# Patient Record
Sex: Male | Born: 1945 | Race: White | Hispanic: No | Marital: Married | Smoking: Never smoker
Health system: Southern US, Community
[De-identification: ages and names within clinical notes are randomized; demographics above are authoritative.]

## PROBLEM LIST (undated history)

## (undated) DIAGNOSIS — I499 Cardiac arrhythmia, unspecified: Secondary | ICD-10-CM

## (undated) HISTORY — DX: Cardiac arrhythmia, unspecified: I49.9

## (undated) HISTORY — PX: OTHER SURGICAL HISTORY: SHX169

---

## 2000-12-02 ENCOUNTER — Encounter: Admission: RE | Admit: 2000-12-02 | Discharge: 2000-12-02 | Payer: Self-pay | Admitting: *Deleted

## 2000-12-03 ENCOUNTER — Encounter: Admission: RE | Admit: 2000-12-03 | Discharge: 2001-03-03 | Payer: Self-pay | Admitting: *Deleted

## 2002-05-04 ENCOUNTER — Encounter: Payer: Self-pay | Admitting: Sports Medicine

## 2002-05-04 ENCOUNTER — Ambulatory Visit (HOSPITAL_COMMUNITY): Admission: RE | Admit: 2002-05-04 | Discharge: 2002-05-04 | Payer: Self-pay | Admitting: Sports Medicine

## 2012-11-17 ENCOUNTER — Encounter (HOSPITAL_COMMUNITY): Payer: Self-pay | Admitting: *Deleted

## 2012-11-17 ENCOUNTER — Emergency Department (HOSPITAL_COMMUNITY)
Admission: EM | Admit: 2012-11-17 | Discharge: 2012-11-17 | Disposition: A | Discharge status: Nursing Home (Includes ICF) | Payer: 59 | Source: Home / Self Care | Attending: Family Medicine | Admitting: Family Medicine

## 2012-11-17 ENCOUNTER — Emergency Department (HOSPITAL_COMMUNITY): Payer: 59

## 2012-11-17 ENCOUNTER — Other Ambulatory Visit: Payer: Self-pay

## 2012-11-17 ENCOUNTER — Emergency Department (HOSPITAL_COMMUNITY)
Admission: EM | Admit: 2012-11-17 | Discharge: 2012-11-17 | Disposition: A | Discharge status: Home / Self Care | Payer: 59 | Attending: Emergency Medicine | Admitting: Emergency Medicine

## 2012-11-17 ENCOUNTER — Encounter (HOSPITAL_COMMUNITY): Payer: Self-pay | Admitting: Emergency Medicine

## 2012-11-17 DIAGNOSIS — I498 Other specified cardiac arrhythmias: Secondary | ICD-10-CM | POA: Insufficient documentation

## 2012-11-17 DIAGNOSIS — R008 Other abnormalities of heart beat: Secondary | ICD-10-CM

## 2012-11-17 DIAGNOSIS — I10 Essential (primary) hypertension: Secondary | ICD-10-CM

## 2012-11-17 LAB — POCT I-STAT TROPONIN I: Troponin i, poc: 0.02 ng/mL (ref 0.00–0.08)

## 2012-11-17 LAB — POCT I-STAT, CHEM 8
HCT: 44 % (ref 39.0–52.0)
Hemoglobin: 15 g/dL (ref 13.0–17.0)
Potassium: 4.2 mEq/L (ref 3.5–5.1)
Sodium: 139 mEq/L (ref 135–145)
TCO2: 24 mmol/L (ref 0–100)

## 2012-11-17 LAB — TSH: TSH: 1.616 u[IU]/mL (ref 0.350–4.500)

## 2012-11-17 NOTE — ED Provider Notes (Signed)
Pt presents with asymptomatic ventricular bigeminy.  He has no acute symptoms including no fevers chills nausea vomiting shortness of breath chest pain or palpitations. This is asymptomatic, seen on EKG at his dental office as well as at the urgent care prior to arrival. His EKG here shows ventricular bigeminy however on the monitor during my dilation the patient is a normal sinus rhythm and has a normal cardiac and pulmonary exam with no murmurs, no swelling, normal lung sounds and normal mental status. Labs and chest x-ray to evaluate for other sources, the patient will be able to followup in the outpatient setting for Holter monitor testing as guided in needed by cardiology. We'll give referral for outpatient followup.  I  have seen and agree with the EKG interpretation is provided by the resident.  Vida Roller, MD 11/17/12 [phone]

## 2012-11-17 NOTE — ED Notes (Signed)
Pt sent from [facility] with a reported HR in the 30's Pt HR in triage 37-78. Pt denies any dizziness or CP.

## 2012-11-17 NOTE — ED Notes (Signed)
Patient reports to [facility] from [facility] via POV for low HR in the 30s. Patient reports that he went to dentist yesterday for a procedure and was told that he had some "irregularities with his heart" and should f/u with PCP or urgent care. Patient reported to [facility] today and was noted have normal HR that occasionally drops in the 30s. Patient denies CP, SOB, dizziness, syncope, or recent illness. Denies any cardiac hx

## 2012-11-17 NOTE — ED Notes (Signed)
Attempted calling pts family in waiting room twice no response.

## 2012-11-17 NOTE — ED Provider Notes (Signed)
History    CSN: [id number] Arrival date & time 11/17/12  1642  First MD Initiated Contact with Patient 11/17/12 1651     Chief Complaint  Patient presents with  . Irregular Heart Beat   (Consider location/radiation/quality/duration/timing/severity/associated sxs/prior Treatment) HPI Comments: Pt w/ no significant PMHx now w/ ectopy. Had oral surgery yesterday and noted frequent ectopy on monitor. Went to urgent care today and noted HR ranging from 30-90, ECG w/ bigeminy and mild HTN. Pt is completely asymptomatic. Denies chest pain, dyspnea, palpitations, LH/dizzy or syncope. No hx of CAD. Healthy - exercises strenuously 4x/wk w/out dyspnea or CP. + family hx of CHF. No recent travel, tic bites, rash, wt gain/loss. On arrival HR is 30 in triage - when placed on monitor in room - NSR at 80 - no ectopy.  Patient is a 67 y.o. male presenting with general illness. The history is provided by the patient. No language interpreter was used.  Illness Location:  Cardio Quality:  Ectopy  Severity:  Moderate Onset quality:  Sudden Timing:  Intermittent Progression:  Unchanged Chronicity:  New Associated symptoms: no abdominal pain, no chest pain, no congestion, no cough, no diarrhea, no fever, no headaches, no nausea, no rash, no shortness of breath, no sore throat and no vomiting    History reviewed. No pertinent past medical history. Past Surgical History  Procedure Laterality Date  . Tendon repair      quad and achilles   No family history on file. History  Substance Use Topics  . Smoking status: Never Smoker   . Smokeless tobacco: Not on file  . Alcohol Use: Yes    Review of Systems  Constitutional: Negative for fever and chills.  HENT: Negative for congestion and sore throat.   Respiratory: Negative for cough and shortness of breath.   Cardiovascular: Negative for chest pain, palpitations and leg swelling.  Gastrointestinal: Negative for nausea, vomiting, abdominal pain,  diarrhea and constipation.  Genitourinary: Negative for dysuria and frequency.  Skin: Negative for color change and rash.  Neurological: Negative for dizziness and headaches.  Psychiatric/Behavioral: Negative for confusion and agitation.  All other systems reviewed and are negative.    Allergies  Review of patient's allergies indicates no known allergies.  Home Medications  No current outpatient prescriptions on file. BP 165/62  Pulse 37  Temp(Src) 98 F (36.7 C) (Oral)  Resp 18  SpO2 95% Physical Exam  Constitutional: He is oriented to person, place, and time. He appears well-developed and well-nourished. No distress.  HENT:  Head: Normocephalic and atraumatic.  Eyes: EOM are normal. Pupils are equal, round, and reactive to light.  Neck: Normal range of motion. Neck supple.  Cardiovascular: Normal rate, regular rhythm and normal pulses.  Frequent extrasystoles are present.  Pulses:      Radial pulses are 2+ on the right side, and 2+ on the left side.  Pulmonary/Chest: Effort normal and breath sounds normal. No respiratory distress. He has no decreased breath sounds. He has no wheezes. He has no rhonchi. He has no rales.  Abdominal: Soft. He exhibits no distension.  Musculoskeletal: Normal range of motion. He exhibits no edema.  Neurological: He is alert and oriented to person, place, and time.  Skin: Skin is warm and dry.  Psychiatric: He has a normal mood and affect. His behavior is normal.    ED Course  Procedures (including critical care time) Labs Reviewed  TSH   Results for orders placed during the hospital encounter of 11/17/12  TSH      Result Value Range   TSH 1.616  0.350 - 4.500 uIU/mL  POCT I-STAT, CHEM 8      Result Value Range   Sodium 139  135 - 145 mEq/L   Potassium 4.2  3.5 - 5.1 mEq/L   Chloride 103  96 - 112 mEq/L   BUN 14  6 - 23 mg/dL   Creatinine, Ser 1.61  0.50 - 1.35 mg/dL   Glucose, Bld 096 (*) 70 - 99 mg/dL   Calcium, Ion 0.45 (*) 1.13 -  1.30 mmol/L   TCO2 24  0 - 100 mmol/L   Hemoglobin 15.0  13.0 - 17.0 g/dL   HCT 40.9  81.1 - 91.4 %  POCT I-STAT TROPONIN I      Result Value Range   Troponin i, poc 0.02  0.00 - 0.08 ng/mL   Comment 3             Date: 11/18/2012  Rate: 78  Rhythm: normal sinus rhythm and bigeminy  QRS Axis: left  Intervals: normal  ST/T Wave abnormalities: normal  Conduction Disutrbances:none  Narrative Interpretation:   Old EKG Reviewed: none available   No results found. No diagnosis found.  MDM  Exam as above, asymptomatic, NAD, ECG w/ bigeminy, no acute ischemia - no ST elevation. CXR - NACPF, troponin negative, TSH normal, no electrolyte abnormality. At this time doubt ACS. Bigeminy of unknown etiology. No bradycardia noted - recorded pulse in 30s was from pulse ox - reading PVC as T wave. No high grade block on ECG. No indication to admit to hospital. Recommend close follow up w/ pcp this wk and given referral to cardiology for further eval. D/c home in good and stable condition. Given return precautions for chest pain, dyspnea, palpitations or syncope.   I have personally reviewed labs and imaging and considered in my MDM. Case d/w Dr Hyacinth Meeker.   1. Bigeminal pulse    [facility] [address], [address] [place] [address] [phone]   for Holter Monitor     Audelia Hives, MD 11/18/12 [phone]

## 2012-11-17 NOTE — ED Notes (Signed)
Pt reports being told by the oral surgeon yesterday that his heart beat was irregular (extra beat) Pt denies chest pain and sob. Family hx of CHF. "feels fine" Pt is sitting up right alert/oriented.

## 2012-11-17 NOTE — ED Notes (Signed)
MD at bedside. 

## 2012-11-17 NOTE — ED Provider Notes (Addendum)
History    CSN: [id number] Arrival date & time 11/17/12  1544  First MD Initiated Contact with Patient 11/17/12 1554     Chief Complaint  Patient presents with  . Irregular Heart Beat    told by oral surgeon that his heart beat was irregular   (Consider location/radiation/quality/duration/timing/severity/associated sxs/prior Treatment) HPI Comments: 67 year old male non smoker, with no significant past medical history and on no medications. Has not been seen by a medical provider in over 10 years. Comes complaining of "irregular heartbeat". Patient was having a dental procedure yesterday when he was attached to a heart monitor; was told he had and irregular heart beat and was asked to go to the hospital to have it checked. He denies dizziness or syncope. Denies palpitations, chest pain or shortness of breath. No PND, fatigue on exertion or leg swelling. No prior personal or family h/o of heart problems.   History reviewed. No pertinent past medical history. Past Surgical History  Procedure Laterality Date  . Tendon repair      quad and achilles   History reviewed. No pertinent family history. History  Substance Use Topics  . Smoking status: Never Smoker   . Smokeless tobacco: Not on file  . Alcohol Use: Yes    Review of Systems  Constitutional: Negative for fever, chills, diaphoresis, activity change, appetite change and fatigue.  Eyes: Negative for visual disturbance.  Respiratory: Negative for shortness of breath and wheezing.   Cardiovascular: Negative for chest pain, palpitations and leg swelling.  Gastrointestinal: Negative for nausea, vomiting and abdominal pain.  Endocrine: Negative for cold intolerance, heat intolerance, polydipsia, polyphagia and polyuria.  Neurological: Negative for dizziness, syncope and headaches.  All other systems reviewed and are negative.    Allergies  Review of patient's allergies indicates no known allergies.  Home Medications  No  current outpatient prescriptions on file. BP 173/64  Pulse 38  Temp(Src) 98.2 F (36.8 C) (Oral)  Resp 14  SpO2 99% Physical Exam  Nursing note and vitals reviewed. Constitutional: He is oriented to person, place, and time. He appears well-developed and well-nourished. No distress.  Somehow flushed face  HENT:  Head: Normocephalic and atraumatic.  Mouth/Throat: Oropharynx is clear and moist.  Eyes: Conjunctivae are normal. Pupils are equal, round, and reactive to light. No scleral icterus.  Neck: Neck supple. No JVD present. No thyromegaly present.  Cardiovascular: Intact distal pulses.  Exam reveals no gallop and no friction rub.   No murmur heard. Is frequent premature beats.  No low extremity edema  Pulmonary/Chest: Effort normal and breath sounds normal. No respiratory distress. He has no wheezes. He has no rales. He exhibits no tenderness.  Abdominal: Soft. Bowel sounds are normal. He exhibits no distension and no mass. There is no tenderness. There is no rebound and no guarding.  Neurological: He is alert and oriented to person, place, and time.  Skin: He is not diaphoretic.    ED Course  Procedures (including critical care time) Labs Reviewed - No data to display No results found. 1. Ventricular bigeminy   2. Hypertension    EKG: Ventricular rate 79 beats per minutes sinus rhythm with a first degree AV block and bigeminy type of premature ventricular contractions. Right bundle branch block. Left axis deviation. No obvious acute ischemic changes. No prior EKG for comparison.   MDM  66 year old male here c/o irregular heart beats noted during a dental procedure yesterday. On exam: Patient heart rate from 38-79 bpm blood pressure with somehow  wide differential 173/64 otherwise stable vital signs. Normal lung exam and no low extremity edema. Patient asymptomatic here. Decided to transfer patient to [facility] for further evaluation and management. No other  testing has been performed at [facility] prior to transfer.  Sharin Grave, MD 11/17/12 [id number]  Sharin Grave, MD 11/17/12 1635

## 2012-11-20 NOTE — ED Provider Notes (Signed)
Medical screening examination/treatment/procedure(s) were conducted as a shared visit with non-physician practitioner(s) and myself.  I personally evaluated the patient during the encounter  Please see my separate respective documentation pertaining to this patient encounter  I have personally reviewed and interpreted the EKG and agree with the resident interpretation.  Vida Roller, MD 11/20/12 [phone]

## 2012-12-14 ENCOUNTER — Encounter: Payer: Self-pay | Admitting: Cardiovascular Disease

## 2012-12-14 ENCOUNTER — Encounter (INDEPENDENT_AMBULATORY_CARE_PROVIDER_SITE_OTHER): Payer: 59

## 2012-12-14 ENCOUNTER — Telehealth: Payer: Self-pay | Admitting: *Deleted

## 2012-12-14 ENCOUNTER — Other Ambulatory Visit: Payer: Self-pay | Admitting: *Deleted

## 2012-12-14 DIAGNOSIS — R002 Palpitations: Secondary | ICD-10-CM

## 2012-12-14 NOTE — Telephone Encounter (Signed)
24 hr Holter monitor placed on Pt 12/14/12 TK

## 2012-12-20 ENCOUNTER — Telehealth: Payer: Self-pay | Admitting: Cardiovascular Disease

## 2012-12-20 NOTE — Telephone Encounter (Signed)
New Problem  Pt is returning a call about holt monitor// was told to bring it back in and drop off. A rep would get in touch w/ him... Pt states he hasn't heard back from anyone/// please assist.

## 2012-12-22 NOTE — Telephone Encounter (Signed)
LMTCB ./CY 

## 2012-12-22 NOTE — Telephone Encounter (Signed)
PT AWARE WILL CALL  WITH RESULTS ONCE DR Eden Emms REVIEWS TEST .Zack Seal

## 2012-12-22 NOTE — Telephone Encounter (Signed)
Pt rtn call to christine [phone]

## 2012-12-24 ENCOUNTER — Telehealth: Payer: Self-pay | Admitting: *Deleted

## 2012-12-24 NOTE — Telephone Encounter (Signed)
PT AWARE OF  HOLTER RESULTS PER DR NISHAN   SR  PAC'S PVC'S  NO SIG  VT OR HEART BLOCK  F/U APPT MADE WITH DR  Ladona Ridgel FOR  01-20-13 AT  3:30 PM .Zack Seal

## 2013-01-04 ENCOUNTER — Encounter: Payer: Self-pay | Admitting: Internal Medicine

## 2013-01-04 ENCOUNTER — Ambulatory Visit (INDEPENDENT_AMBULATORY_CARE_PROVIDER_SITE_OTHER): Payer: 59 | Admitting: Internal Medicine

## 2013-01-04 VITALS — BP 188/82 | HR 79 | Ht 70.5 in | Wt 199.4 lb

## 2013-01-04 DIAGNOSIS — I10 Essential (primary) hypertension: Secondary | ICD-10-CM

## 2013-01-04 DIAGNOSIS — I493 Ventricular premature depolarization: Secondary | ICD-10-CM

## 2013-01-04 DIAGNOSIS — I4949 Other premature depolarization: Secondary | ICD-10-CM

## 2013-01-04 DIAGNOSIS — I472 Ventricular tachycardia: Secondary | ICD-10-CM

## 2013-01-04 MED ORDER — CARVEDILOL 3.125 MG PO TABS: 3.1250 mg | ORAL_TABLET | Freq: Two times a day (BID) | ORAL | Status: DC

## 2013-01-04 NOTE — Patient Instructions (Addendum)
Your physician has requested that you have an exercise tolerance test. For further information please visit [email or web address] Please also follow instruction sheet, as given.   Your physician has requested that you have an echocardiogram. Echocardiography is a painless test that uses sound waves to create images of your heart. It provides your doctor with information about the size and shape of your heart and how well your heart's chambers and valves are working. This procedure takes approximately one hour. There are no restrictions for this procedure.  Your physician has recommended you make the following change in your medication:  1) Start Carvedilol 3.125mg  one tablet twice daily

## 2013-01-12 ENCOUNTER — Ambulatory Visit (HOSPITAL_COMMUNITY): Payer: 59 | Attending: Cardiology | Admitting: Radiology

## 2013-01-12 ENCOUNTER — Encounter: Payer: Self-pay | Admitting: Internal Medicine

## 2013-01-12 DIAGNOSIS — I079 Rheumatic tricuspid valve disease, unspecified: Secondary | ICD-10-CM | POA: Insufficient documentation

## 2013-01-12 DIAGNOSIS — I10 Essential (primary) hypertension: Secondary | ICD-10-CM | POA: Insufficient documentation

## 2013-01-12 DIAGNOSIS — I493 Ventricular premature depolarization: Secondary | ICD-10-CM | POA: Insufficient documentation

## 2013-01-12 DIAGNOSIS — I379 Nonrheumatic pulmonary valve disorder, unspecified: Secondary | ICD-10-CM | POA: Insufficient documentation

## 2013-01-12 DIAGNOSIS — I4949 Other premature depolarization: Secondary | ICD-10-CM | POA: Insufficient documentation

## 2013-01-12 DIAGNOSIS — I059 Rheumatic mitral valve disease, unspecified: Secondary | ICD-10-CM | POA: Insufficient documentation

## 2013-01-12 NOTE — Progress Notes (Signed)
Echocardiogram performed.  

## 2013-01-12 NOTE — Assessment & Plan Note (Signed)
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 He is minimally symptomatic. I have recommended initiation of a beta blocker. Will follow.

## 2013-01-12 NOTE — Progress Notes (Signed)
HPI Mr. Hack is referred today by the ER for evaluation of PVC's. The patient is a pleasant 67 yo man who has been otherwise healthy. He has palpitations and an ECG demonstrates frequent PVC's in a bigeminal fashion. The patient has mild palpitations and a recent 2D echo demonstrates preserved LV function. He has not had syncope or chest pressure. He has been on no medical therapy to date. He denies syncope. The QRS morphology of his PVC's demonstrates a RBBB morphology, and a superior axis with transition from positive to negative in lead V4. He denies peripheral edema. No Known Allergies   Current Outpatient Prescriptions  Medication Sig Dispense Refill  . cephALEXin (KEFLEX) 500 MG capsule Take 500 mg by mouth 4 (four) times daily.      Marland Kitchen HYDROcodone-acetaminophen (NORCO) 10-325 MG per tablet Take 1 tablet by mouth every 4 (four) hours as needed for pain.      . carvedilol (COREG) 3.125 MG tablet Take 1 tablet (3.125 mg total) by mouth 2 (two) times daily.  180 tablet  3   No current facility-administered medications for this visit.     Past Medical History  Diagnosis Date  . Irregular heart beat     ROS:   All systems reviewed and negative except as noted in the HPI.   Past Surgical History  Procedure Laterality Date  . Tendon repair      quad and achilles     No family history on file.   History   Social History  . Marital Status: Married    Spouse Name: N/A    Number of Children: N/A  . Years of Education: N/A   Occupational History  . Not on file.   Social History Main Topics  . Smoking status: Never Smoker   . Smokeless tobacco: Never Used  . Alcohol Use: Yes  . Drug Use: No  . Sexual Activity: Yes   Other Topics Concern  . Not on file   Social History Narrative  . No narrative on file     BP 188/82  Pulse 79  Ht 5' 10.5" (1.791 m)  Wt 199 lb 6.4 oz (90.447 kg)  BMI 28.2 kg/m2  Physical Exam:  Well appearing NAD HEENT:  Unremarkable Neck:  No JVD, no thyromegally Lymphatics:  No adenopathy Back:  No CVA tenderness Lungs:  Clear with no wheezes HEART:  IRegular rate rhythm, no murmurs, no rubs, no clicks Abd:  soft, positive bowel sounds, no organomegally, no rebound, no guarding Ext:  2 plus pulses, no edema, no cyanosis, no clubbing Skin:  No rashes no nodules Neuro:  CN II through XII intact, motor grossly intact  ECG: NSR with frequent PVC's in a bigeminal fashion.  Assess/Plan:

## 2013-01-12 NOTE — Assessment & Plan Note (Signed)
His blood pressure is elevated. I have asked him to start a low dose of beta blocker and will and reduce his sodium intake.

## 2013-01-20 ENCOUNTER — Ambulatory Visit: Payer: 59 | Admitting: Internal Medicine

## 2013-01-24 ENCOUNTER — Ambulatory Visit (INDEPENDENT_AMBULATORY_CARE_PROVIDER_SITE_OTHER): Payer: 59 | Admitting: Internal Medicine

## 2013-01-24 DIAGNOSIS — I4949 Other premature depolarization: Secondary | ICD-10-CM

## 2013-01-24 DIAGNOSIS — I493 Ventricular premature depolarization: Secondary | ICD-10-CM

## 2013-01-24 MED ORDER — CARVEDILOL 6.25 MG PO TABS: 6.2500 mg | ORAL_TABLET | Freq: Two times a day (BID) | ORAL | Status: DC

## 2013-01-24 NOTE — Patient Instructions (Signed)
Your physician has recommended you make the following change in your medication:   1. Increase Carvedilol to 6.25 mg twice daily.

## 2013-01-24 NOTE — Addendum Note (Signed)
Addended by: Blanchard Willhite, Bartolo Darter D on: 01/24/2013 12:20 PM   Modules accepted: Orders, Medications

## 2013-01-24 NOTE — Progress Notes (Signed)
Exercise Treadmill Test  Pre-Exercise Testing Evaluation Rhythm: normal sinus  Rate: 70     Test  Exercise Tolerance Test Ordering MD: Lewayne Bunting, MD  Interpreting MD: Lewayne Bunting, MD  Unique Test No: 1  Treadmill:  1  Indication for ETT: PVC's  Contraindication to ETT: No   Stress Modality: exercise - treadmill  Cardiac Imaging Performed: non   Protocol: standard Bruce - maximal  Max BP:  231/90  Max MPHR (bpm):  152 85% MPR (bpm):  139  MPHR obtained (bpm):  160 % MPHR obtained:  105  Reached 85% MPHR (min:sec):  3:39 Total Exercise Time (min-sec):  9:00  Workload in METS:  10.1 Borg Scale: 15  Reason ETT Terminated:  exaggerated hypertensive response    ST Segment Analysis At Rest: normal ST segments - no evidence of significant ST depression With Exercise: no evidence of significant ST depression  Other Information Arrhythmia:  No Angina during ETT:  absent (0) Quality of ETT:  diagnostic  ETT Interpretation:  normal - no evidence of ischemia by ST analysis  Comments: Hypertensive response to exercise. No exercise induced arrhythmias or pVC's. PVC's noticed in recovery  Recommendations: Uptitrate beta blocker.

## 2013-02-08 ENCOUNTER — Encounter: Payer: 59 | Attending: Family Medicine

## 2013-02-08 VITALS — Ht 60.0 in | Wt 195.1 lb

## 2013-02-08 DIAGNOSIS — Z713 Dietary counseling and surveillance: Secondary | ICD-10-CM | POA: Insufficient documentation

## 2013-02-08 DIAGNOSIS — E119 Type 2 diabetes mellitus without complications: Secondary | ICD-10-CM | POA: Insufficient documentation

## 2013-02-15 ENCOUNTER — Encounter: Payer: 59 | Admitting: *Deleted

## 2013-02-15 DIAGNOSIS — E119 Type 2 diabetes mellitus without complications: Secondary | ICD-10-CM

## 2013-02-15 NOTE — Progress Notes (Signed)
Patient was seen on 02/08/13 for the first of a series of three diabetes self-management courses at the [facility].  Current HbA1c: 7.7%  The following learning objectives were met by the patient during this class:  Describe diabetes  State some common risk factors for diabetes  Defines the role of glucose and insulin  Identifies type of diabetes and pathophysiology  Describe the relationship between diabetes and cardiovascular risk  State the members of the Healthcare Team  States the rationale for glucose monitoring  State when to test glucose  State their individual Target Range  State the importance of logging glucose readings  Describe how to interpret glucose readings  Identifies A1C target  Explain the correlation between A1c and eAG values  State symptoms and treatment of high blood glucose  State symptoms and treatment of low blood glucose  Explain proper technique for glucose testing  Identifies proper sharps disposal  Handouts given during class include:  Living Well with Diabetes book  Carb Counting and Meal Planning book  Meal Plan Card  Carbohydrate guide  Meal planning worksheet  Low Sodium Flavoring Tips  The diabetes portion plate  Low Carbohydrate Snack Suggestions  A1c to eAG Conversion Chart  Diabetes Medications  Stress Management  Diabetes Recommended Care Schedule  Diabetes Success Plan  Core Class Satisfaction Survey  Your patient has identified their diabetes care support plan as:  [facility]  Staff  PCP  Follow-Up Plan:  Attend core 2  

## 2013-02-16 ENCOUNTER — Telehealth: Payer: Self-pay | Admitting: Internal Medicine

## 2013-02-16 NOTE — Progress Notes (Signed)
Patient was seen on 02/15/13 for the second of a series of three diabetes self-management courses at the [facility]. The following learning objectives were met by the patient during this class:   Describe the role of different macronutrients on glucose  Explain how carbohydrates affect blood glucose  State what foods contain the most carbohydrates  Demonstrate carbohydrate counting  Demonstrate how to read Nutrition Facts food label  Describe effects of various fats on heart health  Describe the importance of good nutrition for health and healthy eating strategies  Describe techniques for managing your shopping, cooking and meal planning  List strategies to follow meal plan when dining out  Describe the effects of alcohol on glucose and how to use it safely   Follow-Up Plan:  Attend Core 3  Work towards following your personal food plan.    

## 2013-02-16 NOTE — Telephone Encounter (Signed)
New Problem   Pt recently had an GXT and was placed on BP Meds//He asks if a f/up appt is needed// please advise

## 2013-02-18 NOTE — Telephone Encounter (Signed)
Will have patient see Dr Ladona Ridgel in 4 weeks

## 2013-02-22 DIAGNOSIS — E119 Type 2 diabetes mellitus without complications: Secondary | ICD-10-CM

## 2013-02-28 NOTE — Progress Notes (Signed)
Patient was seen on 02/22/13 for the third of a series of three diabetes self-management courses at the [facility]. The following learning objectives were met by the patient during this class:    State the amount of activity recommended for healthy living   Describe activities suitable for individual needs   Identify ways to regularly incorporate activity into daily life   Identify barriers to activity and ways to over come these barriers  Identify diabetes medications being personally used and their primary action for lowering glucose and possible side effects   Describe role of stress on blood glucose and develop strategies to address psychosocial issues   Identify diabetes complications and ways to prevent them  Explain how to manage diabetes during illness   Evaluate success in meeting personal goal   Establish 2-3 goals that they will plan to diligently work on until they return for the free 80-month follow-up visit  Your patient has established the following 4 month goals in their individualized success plan:  Count Carbohydrates at most meals and snacks, reduce fat intake by eating less mayo, salad dressing etc at 2 or more meals/day  Increase activity at least 4 days a week  Take diabetes medications as directed and test my glucose at least 1/day, 5 days a week  To help manage my stress, I will walk dog at least 6 times a week  Your patient has identified these potential barriers to change:  Stress at work - can be mitigated  Your patient has identified their diabetes self-care support plan as  Build family support through communication

## 2013-03-08 ENCOUNTER — Encounter: Payer: Self-pay | Admitting: Internal Medicine

## 2013-03-08 ENCOUNTER — Ambulatory Visit (INDEPENDENT_AMBULATORY_CARE_PROVIDER_SITE_OTHER): Payer: 59 | Admitting: Internal Medicine

## 2013-03-08 VITALS — BP 146/88 | HR 69 | Ht 70.0 in | Wt 189.0 lb

## 2013-03-08 DIAGNOSIS — I10 Essential (primary) hypertension: Secondary | ICD-10-CM

## 2013-03-08 DIAGNOSIS — I4949 Other premature depolarization: Secondary | ICD-10-CM

## 2013-03-08 DIAGNOSIS — I493 Ventricular premature depolarization: Secondary | ICD-10-CM

## 2013-03-08 MED ORDER — CARVEDILOL 6.25 MG PO TABS: 6.1250 mg | ORAL_TABLET | Freq: Two times a day (BID) | ORAL | Status: DC

## 2013-03-08 NOTE — Assessment & Plan Note (Signed)
His PVCs are much improved. We'll ask him today to increase his carvedilol from 3.125 mg up to 6.25 mg twice daily.

## 2013-03-08 NOTE — Patient Instructions (Signed)
Your physician wants you to follow-up in: 12 months with Dr Court Joy will receive a reminder letter in the mail two months in advance. If you don't receive a letter, please call our office to schedule the follow-up appointment.   Your physician has recommended you make the following change in your medication:  1) Increase 6.25mg  twice daily

## 2013-03-08 NOTE — Assessment & Plan Note (Signed)
His blood pressure is a bit elevated. He is encouraged to maintain a low-sodium diet, and we'll plan to up titrate his medications as noted previously.

## 2013-03-08 NOTE — Progress Notes (Signed)
      HPI [name] returns today for followup. He is a pleasant 67 yo man with a history of symptomatic PVCs and hypertension. I saw him last several months ago. At that time we placed him on low-dose beta blocker therapy,, and asked that he avoid caffeine and alcohol. In the interim he has done much better. His palpitations have almost resolved. He has tolerated his beta blocker nicely. His blood pressure remains somewhat elevated. No Known Allergies   Current Outpatient Prescriptions  Medication Sig Dispense Refill  . buPROPion (WELLBUTRIN XL) 300 MG 24 hr tablet Take 1 tablet by mouth daily.      . carvedilol (COREG) 3.125 MG tablet Take 3.125 mg by mouth 2 (two) times daily with a meal.      . metFORMIN (GLUCOPHAGE-XR) 500 MG 24 hr tablet Take 500 mg by mouth daily with breakfast.       No current facility-administered medications for this visit.     Past Medical History  Diagnosis Date  . Irregular heart beat     ROS:   All systems reviewed and negative except as noted in the HPI.   Past Surgical History  Procedure Laterality Date  . Tendon repair      quad and achilles     No family history on file.   History   Social History  . Marital Status: Married    Spouse Name: N/A    Number of Children: N/A  . Years of Education: N/A   Occupational History  . Not on file.   Social History Main Topics  . Smoking status: Never Smoker   . Smokeless tobacco: Never Used  . Alcohol Use: Yes  . Drug Use: No  . Sexual Activity: Yes   Other Topics Concern  . Not on file   Social History Narrative  . No narrative on file     BP 146/88  Pulse 69  Ht 5\' 10"  (1.778 m)  Wt 189 lb (85.73 kg)  BMI 27.12 kg/m2  Physical Exam:  Well appearing 67 year old man, NAD HEENT: Unremarkable Neck:  No JVD, no thyromegally Back:  No CVA tenderness Lungs:  Clear with no wheezes, rales, or rhonchi. HEART:  Regular rate rhythm, no murmurs, no rubs, no clicks Abd:  soft,  positive bowel sounds, no organomegally, no rebound, no guarding Ext:  2 plus pulses, no edema, no cyanosis, no clubbing Skin:  No rashes no nodules Neuro:  CN II through XII intact, motor grossly intact   DEVICE  Normal device function.  See PaceArt for details.   Assess/Plan:

## 2013-03-10 ENCOUNTER — Other Ambulatory Visit: Payer: Self-pay

## 2013-06-28 ENCOUNTER — Encounter: Payer: 59 | Attending: Family Medicine | Admitting: Dietician

## 2013-06-28 VITALS — Ht 70.0 in | Wt 189.1 lb

## 2013-06-28 DIAGNOSIS — E119 Type 2 diabetes mellitus without complications: Secondary | ICD-10-CM | POA: Insufficient documentation

## 2013-06-28 NOTE — Patient Instructions (Signed)
Goals:  Follow Diabetes Meal Plan as instructed  Eat 3 meals and 2 snacks, every 3-5 hrs  Limit carbohydrate intake to 45-60 grams carbohydrate/meal  Limit carbohydrate intake to 15 grams carbohydrate/snack  Add lean protein foods to meals/snacks  Monitor glucose levels as instructed by your doctor  Aim for 30 mins of physical activity daily  Bring food record and glucose log to your next nutrition visit 

## 2013-06-28 NOTE — Progress Notes (Signed)
  Patient was seen on 06/28/2013 for their 3 month follow-up as a part of the diabetes self-management courses at the [facility]. The following learning objectives were met by your patient during this course:  Reports that the Hgb A1c dropped to ~5.7% around 1 month ago (down from 7.7%) Lost about 14 lbs since October (20 lbs since the summer). Things are going well, though he "feels hungry all of the time" ever since he's been eating less.   Wt Readings from Last 3 Encounters:  06/28/13 189 lb 1.6 oz (85.775 kg)  03/08/13 189 lb (85.73 kg)  02/15/13 195 lb 1.6 oz (88.497 kg)   Ht Readings from Last 3 Encounters:  06/28/13 $RemoveB'5\' 10"'NhoUYswu$  (1.778 m)  03/08/13 $RemoveB'5\' 10"'tmsPiTar$  (1.778 m)  02/15/13 5' (1.524 m)   Body mass index is 27.13 kg/(m^2). $RemoveBeforeD'@BMIFA'EVZZpFGXVhFZsI$ @ Normalized weight-for-age data available only for age 48 to 70 years. Normalized stature-for-age data available only for age 48 to 30 years.   Patient self reports the following:  Diabetes control has improved since diabetes self-management training: testing 2 x and getting around 90-157 mg/dl.    Number of days blood glucose is >200: none Last MD appointment for diabetes: 3 weeks ago, due to go back in less than 1 month Changes in treatment plan: added lisinopril, no changed in diabetes care Confidence with ability to manage diabetes: only concern is measuring what he's eating Areas for improvement with diabetes self-care: only concern is measuring what he's eating Willingness to participate in diabetes support group: may do that, it is hard since he is still working  Please see Diabetes Flow sheet for findings related to patient's self-care.  Goals from Core 3: 1. Count Carbohydrates at most meals and snacks, reduce fat intake by eating less mayo, salad dressing etc at 2 or more meals/day   Feels like he is doing this 2. Increase activity at least 4 days a week  Averaging between 3-4 x week 3. Take diabetes medications  as directed and test my glucose at least 1/day, 5 days a week  Taking medication and testing just a few times per week 4. To help manage my stress, I will walk dog at least 6 times a week  Definitely doing this  Follow-Up Plan: Patient is eligible for a "free" 30 minute diabetes self-care appointment in the next year. Patient to call and schedule as needed.

## 2014-03-06 ENCOUNTER — Other Ambulatory Visit: Payer: Self-pay | Admitting: Internal Medicine

## 2014-03-07 ENCOUNTER — Other Ambulatory Visit: Payer: Self-pay

## 2015-02-14 ENCOUNTER — Other Ambulatory Visit: Payer: Self-pay | Admitting: Internal Medicine

## 2015-02-25 ENCOUNTER — Other Ambulatory Visit: Payer: Self-pay | Admitting: Internal Medicine

## 2015-03-24 ENCOUNTER — Other Ambulatory Visit: Payer: Self-pay | Admitting: Internal Medicine

## 2015-04-25 ENCOUNTER — Encounter: Payer: Self-pay | Admitting: Internal Medicine

## 2015-04-25 ENCOUNTER — Ambulatory Visit (INDEPENDENT_AMBULATORY_CARE_PROVIDER_SITE_OTHER): Payer: 59 | Admitting: Internal Medicine

## 2015-04-25 ENCOUNTER — Other Ambulatory Visit: Payer: Self-pay | Admitting: Internal Medicine

## 2015-04-25 VITALS — BP 128/74 | HR 52 | Ht 70.0 in | Wt 190.2 lb

## 2015-04-25 DIAGNOSIS — I493 Ventricular premature depolarization: Secondary | ICD-10-CM | POA: Diagnosis not present

## 2015-04-25 DIAGNOSIS — I10 Essential (primary) hypertension: Secondary | ICD-10-CM

## 2015-04-25 NOTE — Patient Instructions (Signed)

## 2015-04-25 NOTE — Assessment & Plan Note (Signed)
His palpitations are well controlled. He will continue his current meds.We discussed caffeine, ETOH, and exercise.

## 2015-04-25 NOTE — Progress Notes (Signed)
      HPI Mr. Ferrero returns today for followup. He is a pleasant 69 yo man with a history of symptomatic PVCs and hypertension. I saw him last 2 years ago. At that time we placed him on low-dose beta blocker therapy, and asked that he avoid caffeine and alcohol. In the interim he has done well.He denies symptomatic palpitations. His blood pressure has been controlled. He walks his dog almost an hour a day. No Known Allergies   Current Outpatient Prescriptions  Medication Sig Dispense Refill  . atorvastatin (LIPITOR) 10 MG tablet Take 10 mg by mouth daily.  1  . buPROPion (WELLBUTRIN XL) 300 MG 24 hr tablet Take 1 tablet by mouth daily.    . carvedilol (COREG) 6.25 MG tablet TAKE 1 TABLET (6.25 MG TOTAL) BY MOUTH 2 (TWO) TIMES DAILY WITH A MEAL. 60 tablet 0  . lisinopril (PRINIVIL,ZESTRIL) 5 MG tablet Take 5 mg by mouth daily.    . metFORMIN (GLUCOPHAGE-XR) 500 MG 24 hr tablet Take 500 mg by mouth daily with breakfast.     No current facility-administered medications for this visit.     Past Medical History  Diagnosis Date  . Irregular heart beat     ROS:   All systems reviewed and negative except as noted in the HPI.   Past Surgical History  Procedure Laterality Date  . Tendon repair      quad and achilles     No family history on file.   Social History   Social History  . Marital Status: Married    Spouse Name: N/A  . Number of Children: N/A  . Years of Education: N/A   Occupational History  . Not on file.   Social History Main Topics  . Smoking status: Never Smoker   . Smokeless tobacco: Never Used  . Alcohol Use: Yes  . Drug Use: No  . Sexual Activity: Yes   Other Topics Concern  . Not on file   Social History Narrative     BP 128/74 mmHg  Pulse 52  Ht 5\' 10"  (1.778 m)  Wt 190 lb 3.2 oz (86.274 kg)  BMI 27.29 kg/m2  Physical Exam:  Well appearing 69 year old man, NAD HEENT: Unremarkable Neck:  6 cm JVD, no thyromegally Back:  No CVA  tenderness Lungs:  Clear with no wheezes, rales, or rhonchi. HEART:  Regular rate rhythm, no murmurs, no rubs, no clicks Abd:  soft, positive bowel sounds, no organomegally, no rebound, no guarding Ext:  2 plus pulses, no edema, no cyanosis, no clubbing Skin:  No rashes no nodules Neuro:  CN II through XII intact, motor grossly intact  ECG - sinus bradycardia  DEVICE  Normal device function.  See PaceArt for details.   Assess/Plan:

## 2015-04-25 NOTE — Assessment & Plan Note (Addendum)
His blood pressure is well controlled. I will see him back as needed.

## 2015-12-03 ENCOUNTER — Other Ambulatory Visit: Payer: Self-pay | Admitting: Internal Medicine

## 2016-06-22 ENCOUNTER — Other Ambulatory Visit: Payer: Self-pay | Admitting: Internal Medicine

## 2016-07-23 ENCOUNTER — Other Ambulatory Visit: Payer: Self-pay | Admitting: Internal Medicine

## 2016-08-24 ENCOUNTER — Other Ambulatory Visit: Payer: Self-pay | Admitting: Internal Medicine

## 2016-09-08 ENCOUNTER — Telehealth: Payer: Self-pay | Admitting: Internal Medicine

## 2016-09-08 MED ORDER — CARVEDILOL 6.25 MG PO TABS: ORAL_TABLET | ORAL | 0 refills | Status: DC

## 2016-09-08 NOTE — Telephone Encounter (Signed)
°*  STAT* If patient is at the pharmacy, call can be transferred to refill team.   1. Which medications need to be refilled? (please list name of each medication and dose if known) Carvedilol, 6.25 mg 2. Which pharmacy/location (including street and city if local pharmacy) is medication to be sent to?[id number] - [place], [place] - [address]   3. Do they need a 30 day or 90 day supply? 90  Patient is scheduled for an appt on 11-25-16

## 2016-11-11 ENCOUNTER — Other Ambulatory Visit: Payer: Self-pay | Admitting: Internal Medicine

## 2016-11-25 ENCOUNTER — Encounter: Payer: Self-pay | Admitting: Internal Medicine

## 2016-11-25 ENCOUNTER — Ambulatory Visit (INDEPENDENT_AMBULATORY_CARE_PROVIDER_SITE_OTHER): Payer: 59 | Admitting: Internal Medicine

## 2016-11-25 VITALS — BP 126/72 | HR 60 | Ht 70.0 in | Wt 188.0 lb

## 2016-11-25 DIAGNOSIS — I10 Essential (primary) hypertension: Secondary | ICD-10-CM

## 2016-11-25 DIAGNOSIS — I493 Ventricular premature depolarization: Secondary | ICD-10-CM | POA: Diagnosis not present

## 2016-11-25 NOTE — Patient Instructions (Signed)

## 2016-11-25 NOTE — Progress Notes (Signed)
      HPI Mr. Lucy returns today for followup. He is a pleasant 71 yo man with a h/o HTN, dyslipidemia and palpitations due to PVC's. When I saw him last about 2 years ago, he was doing well and his symptoms were largely controlled. In the interim he has tolerated his beta blocker and has had only rare palpitations. He has tried to reduce his ETOH consumption.  No Known Allergies   Current Outpatient Prescriptions  Medication Sig Dispense Refill  . atorvastatin (LIPITOR) 10 MG tablet Take 10 mg by mouth daily.  1  . buPROPion (WELLBUTRIN XL) 300 MG 24 hr tablet Take 1 tablet by mouth daily.    . carvedilol (COREG) 6.25 MG tablet TAKE 1 TABLET BY MOUTH TWICE DAILY WITH MEALS. PT MUST KEEP 11/25/16 APPT FOR FURTHER REFILLS. 60 tablet 0  . lisinopril (PRINIVIL,ZESTRIL) 5 MG tablet Take 5 mg by mouth daily.    . metFORMIN (GLUCOPHAGE-XR) 500 MG 24 hr tablet Take 500 mg by mouth daily with breakfast.     No current facility-administered medications for this visit.      Past Medical History:  Diagnosis Date  . Irregular heart beat     ROS:   All systems reviewed and negative except as noted in the HPI.   Past Surgical History:  Procedure Laterality Date  . tendon repair     quad and achilles     No family history on file.   Social History   Social History  . Marital status: Married    Spouse name: N/A  . Number of children: N/A  . Years of education: N/A   Occupational History  . Not on file.   Social History Main Topics  . Smoking status: Never Smoker  . Smokeless tobacco: Never Used  . Alcohol use Yes  . Drug use: No  . Sexual activity: Yes   Other Topics Concern  . Not on file   Social History Narrative  . No narrative on file     BP 126/72   Pulse 60   Ht 5\' 10"  (1.778 m)   Wt 188 lb (85.3 kg)   SpO2 97%   BMI 26.98 kg/m   Physical Exam:  Well appearing 71 yo man, NAD HEENT: Unremarkable Neck:  6 cm JVD, no thyromegally Lymphatics:  No  adenopathy Back:  No CVA tenderness Lungs:  Clear with no wheezes HEART:  Regular rate rhythm, no murmurs, no rubs, no clicks Abd:  soft, positive bowel sounds, no organomegally, no rebound, no guarding Ext:  2 plus pulses, no edema, no cyanosis, no clubbing Skin:  No rashes no nodules Neuro:  CN II through XII intact, motor grossly intact  EKG - NSR with first degree AV block and left axis  Assess/Plan: 1. PVC's - his symptoms appear to be well controlled. He will continue his current meds and I have encouraged him to use ETOH and caffeine in moderation. 2. Dyslipidemia - he will continue his statin therapy.  3. HTN - his blood pressure is well controlled. Will follow.  Leonia ReevesGregg Taylor,M.D.

## 2016-12-10 ENCOUNTER — Other Ambulatory Visit: Payer: Self-pay | Admitting: Internal Medicine

## 2017-12-08 ENCOUNTER — Other Ambulatory Visit: Payer: Self-pay | Admitting: Internal Medicine

## 2018-01-06 ENCOUNTER — Ambulatory Visit: Payer: 59 | Admitting: Internal Medicine

## 2018-01-06 ENCOUNTER — Encounter: Payer: Self-pay | Admitting: Internal Medicine

## 2018-01-06 VITALS — BP 118/74 | HR 50 | Ht 70.0 in | Wt 190.8 lb

## 2018-01-06 DIAGNOSIS — I10 Essential (primary) hypertension: Secondary | ICD-10-CM | POA: Diagnosis not present

## 2018-01-06 DIAGNOSIS — I493 Ventricular premature depolarization: Secondary | ICD-10-CM | POA: Diagnosis not present

## 2018-01-06 MED ORDER — CARVEDILOL 6.25 MG PO TABS: 6.2500 mg | ORAL_TABLET | Freq: Two times a day (BID) | ORAL | 3 refills | Status: DC

## 2018-01-06 NOTE — Progress Notes (Signed)
HPI Wesley Burch returns today for followup. He is a pleasant 72 yo man with a h/o PVC"s, HTN, and sinus node dysfunction. He has done well in the interim. He is still working full time and denies chest pain, sob, or dizziness. No syncope. No edema.  No Known Allergies   Current Outpatient Medications  Medication Sig Dispense Refill  . atorvastatin (LIPITOR) 10 MG tablet Take 10 mg by mouth daily.  1  . buPROPion (WELLBUTRIN XL) 150 MG 24 hr tablet Take 150 mg by mouth daily.  3  . carvedilol (COREG) 6.25 MG tablet Take 1 tablet (6.25 mg total) by mouth 2 (two) times daily with a meal. 180 tablet 3  . lisinopril (PRINIVIL,ZESTRIL) 10 MG tablet Take 10 mg by mouth daily.  2  . metFORMIN (GLUCOPHAGE-XR) 500 MG 24 hr tablet Take 500 mg by mouth daily with breakfast.     No current facility-administered medications for this visit.      Past Medical History:  Diagnosis Date  . Irregular heart beat     ROS:   All systems reviewed and negative except as noted in the HPI.   Past Surgical History:  Procedure Laterality Date  . tendon repair     quad and achilles     History reviewed. No pertinent family history.   Social History   Socioeconomic History  . Marital status: Married    Spouse name: Not on file  . Number of children: Not on file  . Years of education: Not on file  . Highest education level: Not on file  Occupational History  . Not on file  Social Needs  . Financial resource strain: Not on file  . Food insecurity:    Worry: Not on file    Inability: Not on file  . Transportation needs:    Medical: Not on file    Non-medical: Not on file  Tobacco Use  . Smoking status: Never Smoker  . Smokeless tobacco: Never Used  Substance and Sexual Activity  . Alcohol use: Yes  . Drug use: No  . Sexual activity: Yes  Lifestyle  . Physical activity:    Days per week: Not on file    Minutes per session: Not on file  . Stress: Not on file  Relationships  .  Social connections:    Talks on phone: Not on file    Gets together: Not on file    Attends religious service: Not on file    Active member of club or organization: Not on file    Attends meetings of clubs or organizations: Not on file    Relationship status: Not on file  . Intimate partner violence:    Fear of current or ex partner: Not on file    Emotionally abused: Not on file    Physically abused: Not on file    Forced sexual activity: Not on file  Other Topics Concern  . Not on file  Social History Narrative  . Not on file     BP 118/74   Pulse (!) 50   Ht 5\' 10"  (1.778 m)   Wt 190 lb 12.8 oz (86.5 kg)   SpO2 97%   BMI 27.38 kg/m   Physical Exam:  Well appearing 72 yo man, NAD HEENT: Unremarkable Neck:  6 cm JVD, no thyromegally Lymphatics:  No adenopathy Back:  No CVA tenderness Lungs:  Clear with no wheezes HEART:  Regular rate rhythm, no murmurs, no rubs,  no clicks Abd:  soft, positive bowel sounds, no organomegally, no rebound, no guarding Ext:  2 plus pulses, no edema, no cyanosis, no clubbing Skin:  No rashes no nodules Neuro:  CN II through XII intact, motor grossly intact  EKG - sinus brady  Assess/Plan: 1. PVC"s - they have resolved on beta blocker therapy. He will continue. 2. Sinus node dysfunction - his HR is slow but he is asymptomatic. We discussed the symptoms he might experience if his sinus node worsens.  3. HTN - his blood pressure is well controlled. We will follow.  Leonia Reeves.D.

## 2018-01-06 NOTE — Patient Instructions (Signed)

## 2018-12-27 ENCOUNTER — Other Ambulatory Visit: Payer: Self-pay

## 2018-12-27 ENCOUNTER — Emergency Department (HOSPITAL_COMMUNITY): Payer: 59

## 2018-12-27 ENCOUNTER — Emergency Department (HOSPITAL_COMMUNITY)
Admission: EM | Admit: 2018-12-27 | Discharge: 2018-12-28 | Disposition: A | Discharge status: Home / Self Care | Payer: 59 | Attending: Emergency Medicine | Admitting: Emergency Medicine

## 2018-12-27 ENCOUNTER — Encounter (HOSPITAL_COMMUNITY): Payer: Self-pay | Admitting: Emergency Medicine

## 2018-12-27 DIAGNOSIS — Z5321 Procedure and treatment not carried out due to patient leaving prior to being seen by health care provider: Secondary | ICD-10-CM | POA: Insufficient documentation

## 2018-12-27 DIAGNOSIS — R0602 Shortness of breath: Secondary | ICD-10-CM | POA: Insufficient documentation

## 2018-12-27 NOTE — ED Triage Notes (Signed)
Pt BIB [facility] from home, wife reports pt had full body tremors and shortness of breath and wasn't making sense. On EMS arrival, pt A&O x 4 and breathing improved. On arrival to ED, pt still A&O x 4 and no complaints. Denies chest pain/nausea/cough/diaphoresis.

## 2018-12-28 NOTE — ED Notes (Signed)
pts wife stated that patient is feeling better and that they do not feel like they can wait her any longer, she stated she would take pt to his PCP in the morning. Iv removed. Pt ambulatory with nad.

## 2019-01-09 ENCOUNTER — Other Ambulatory Visit: Payer: Self-pay | Admitting: Internal Medicine

## 2019-03-15 ENCOUNTER — Other Ambulatory Visit: Payer: Self-pay

## 2019-03-15 ENCOUNTER — Ambulatory Visit: Payer: 59 | Admitting: Internal Medicine

## 2019-03-15 ENCOUNTER — Encounter: Payer: Self-pay | Admitting: Internal Medicine

## 2019-03-15 VITALS — BP 154/82 | HR 57 | Ht 70.0 in | Wt 187.2 lb

## 2019-03-15 DIAGNOSIS — I493 Ventricular premature depolarization: Secondary | ICD-10-CM | POA: Diagnosis not present

## 2019-03-15 DIAGNOSIS — I10 Essential (primary) hypertension: Secondary | ICD-10-CM | POA: Diagnosis not present

## 2019-03-15 NOTE — Patient Instructions (Addendum)
Medication Instructions:  Your physician recommends that you continue on your current medications as directed. Please refer to the Current Medication list given to you today.   Reduce your salt intake  Check your blood pressure at least once a month   Labwork: None ordered.  Testing/Procedures: None ordered.  Follow-Up: Your physician wants you to follow-up in: one year with Dr. Lovena Le.   You will receive a reminder letter in the mail two months in advance. If you don't receive a letter, please call our office to schedule the follow-up appointment.  Any Other Special Instructions Will Be Listed Below (If Applicable).  If you need a refill on your cardiac medications before your next appointment, please call your pharmacy.

## 2019-03-15 NOTE — Progress Notes (Signed)
HPI [name] returns today for followup. He is a pleasant 73 yo man with a h/o HTN who has PVC's which have improved markedly. He walks 3 miles a day and stays active. He admits to sodium indiscretion. He has not had syncope or peripheral edema or sob.   No Known Allergies   Current Outpatient Medications  Medication Sig Dispense Refill  . atorvastatin (LIPITOR) 10 MG tablet Take 10 mg by mouth daily.  1  . buPROPion (WELLBUTRIN XL) 150 MG 24 hr tablet Take 150 mg by mouth daily.  3  . carvedilol (COREG) 6.25 MG tablet TAKE 1 TABLET (6.25 MG TOTAL) BY MOUTH 2 (TWO) TIMES DAILY WITH A MEAL. 60 tablet 3  . ketoconazole (NIZORAL) 2 % cream Apply 1 application topically as needed for itching.    Marland Kitchen lisinopril (PRINIVIL,ZESTRIL) 10 MG tablet Take 10 mg by mouth daily.  2  . metFORMIN (GLUCOPHAGE-XR) 500 MG 24 hr tablet Take 500 mg by mouth daily with breakfast.     No current facility-administered medications for this visit.      Past Medical History:  Diagnosis Date  . Irregular heart beat     ROS:   All systems reviewed and negative except as noted in the HPI.   Past Surgical History:  Procedure Laterality Date  . tendon repair     quad and achilles     No family history on file.   Social History   Socioeconomic History  . Marital status: Married    Spouse name: Not on file  . Number of children: Not on file  . Years of education: Not on file  . Highest education level: Not on file  Occupational History  . Not on file  Social Needs  . Financial resource strain: Not on file  . Food insecurity    Worry: Not on file    Inability: Not on file  . Transportation needs    Medical: Not on file    Non-medical: Not on file  Tobacco Use  . Smoking status: Never Smoker  . Smokeless tobacco: Never Used  Substance and Sexual Activity  . Alcohol use: Yes  . Drug use: No  . Sexual activity: Yes  Lifestyle  . Physical activity    Days per week: Not on file   Minutes per session: Not on file  . Stress: Not on file  Relationships  . Social Musician on phone: Not on file    Gets together: Not on file    Attends religious service: Not on file    Active member of club or organization: Not on file    Attends meetings of clubs or organizations: Not on file    Relationship status: Not on file  . Intimate partner violence    Fear of current or ex partner: Not on file    Emotionally abused: Not on file    Physically abused: Not on file    Forced sexual activity: Not on file  Other Topics Concern  . Not on file  Social History Narrative  . Not on file     BP (!) 154/82   Pulse (!) 57   Ht 5\' 10"  (1.778 m)   Wt 187 lb 3.2 oz (84.9 kg)   SpO2 96%   BMI 26.86 kg/m   Physical Exam:  Well appearing NAD HEENT: Unremarkable Neck:  No JVD, no thyromegally Lymphatics:  No adenopathy Back:  No CVA tenderness Lungs:  Clear with no wheezes HEART:  Regular rate rhythm, no murmurs, no rubs, no clicks Abd:  soft, positive bowel sounds, no organomegally, no rebound, no guarding Ext:  2 plus pulses, no edema, no cyanosis, no clubbing Skin:  No rashes no nodules Neuro:  CN II through XII intact, motor grossly intact   Assess/Plan: 1. PVC's - he has improved and is currently asymptomatic. He is encouraged to undergo watchful waiting and to call us if his symptoms recur.  2. HTN - he admits to sodium indiscretion. I asked him to reduce his salt intake and to keep walking.  3. Sinus node dysfunction - he remains asymptomatic. His HR is better today.  Wesley Burch.D.

## 2019-05-10 ENCOUNTER — Other Ambulatory Visit: Payer: Self-pay | Admitting: Internal Medicine

## 2019-06-12 ENCOUNTER — Ambulatory Visit: Payer: 59

## 2019-06-29 ENCOUNTER — Ambulatory Visit: Payer: 59

## 2020-01-31 ENCOUNTER — Ambulatory Visit: Payer: 59 | Attending: Internal Medicine

## 2020-01-31 DIAGNOSIS — Z23 Encounter for immunization: Secondary | ICD-10-CM

## 2020-01-31 NOTE — Progress Notes (Signed)
   Covid-19 Vaccination Clinic  Name:  Wesley Burch    MRN: [id number] DOB: [birth year]  01/31/2020  Wesley Burch was observed post Covid-19 immunization for 15 minutes without incident. He was provided with Vaccine Information Sheet and instruction to access the V-Safe system.   Wesley Burch was instructed to call 911 with any severe reactions post vaccine: Marland Kitchen Difficulty breathing  . Swelling of face and throat  . A fast heartbeat  . A bad rash all over body  . Dizziness and weakness

## 2020-05-14 ENCOUNTER — Other Ambulatory Visit: Payer: Self-pay | Admitting: Internal Medicine

## 2020-05-21 ENCOUNTER — Telehealth: Payer: Self-pay | Admitting: *Deleted

## 2020-05-21 NOTE — Telephone Encounter (Signed)
Left message to call back  

## 2020-05-21 NOTE — Telephone Encounter (Signed)
Lvm for appt virtual or r/s

## 2020-05-22 ENCOUNTER — Encounter: Payer: Self-pay | Admitting: Internal Medicine

## 2020-05-22 ENCOUNTER — Telehealth (INDEPENDENT_AMBULATORY_CARE_PROVIDER_SITE_OTHER): Payer: 59 | Admitting: Internal Medicine

## 2020-05-22 VITALS — Ht 70.0 in | Wt 190.0 lb

## 2020-05-22 DIAGNOSIS — I493 Ventricular premature depolarization: Secondary | ICD-10-CM

## 2020-05-22 DIAGNOSIS — I10 Essential (primary) hypertension: Secondary | ICD-10-CM | POA: Diagnosis not present

## 2020-05-22 NOTE — Progress Notes (Signed)
Electrophysiology TeleHealth Note   Due to national recommendations of social distancing due to COVID 19, an audio/video telehealth visit is felt to be most appropriate for this patient at this time.  See MyChart message from today for the patient's consent to telehealth for [facility].   Date:  05/22/2020   ID:  Wesley Burch, Wesley Burch Apr 27, 1946, MRN [id number]  Location: patient's home  Provider location: [address], [place]  Evaluation Performed: Follow-up visit  PCP:  Henrine Screws, MD  Cardiologist:  No primary care provider on file.  Electrophysiologist:  Dr Ladona Ridgel  Chief Complaint:  "I've bee doing alright."  History of Present Illness:    Wesley Burch is a 75 y.o. male who presents via audio/video conferencing for a telehealth visit today.  Since last being seen in our clinic, the patient reports doing very well.  Today, he denies symptoms of palpitations, chest pain, shortness of breath,  lower extremity edema, dizziness, presyncope, or syncope.  The patient is otherwise without complaint today.  The patient denies symptoms of fevers, chills, cough, or new SOB worrisome for COVID 19.  Past Medical History:  Diagnosis Date  . Irregular heart beat     Past Surgical History:  Procedure Laterality Date  . tendon repair     quad and achilles    Current Outpatient Medications  Medication Sig Dispense Refill  . atorvastatin (LIPITOR) 10 MG tablet Take 10 mg by mouth daily.  1  . buPROPion (WELLBUTRIN XL) 150 MG 24 hr tablet Take 150 mg by mouth daily.  3  . carvedilol (COREG) 6.25 MG tablet TAKE 1 TABLET (6.25 MG TOTAL) BY MOUTH 2 (TWO) TIMES DAILY WITH A MEAL. 60 tablet 11  . lisinopril (PRINIVIL,ZESTRIL) 10 MG tablet Take 10 mg by mouth daily.  2  . metFORMIN (GLUCOPHAGE-XR) 500 MG 24 hr tablet Take 500 mg by mouth daily with breakfast.     No current facility-administered medications for this visit.    Allergies:   Patient has no known  allergies.   Social History:  The patient  reports that he has never smoked. He has never used smokeless tobacco. He reports current alcohol use. He reports that he does not use drugs.   Family History:  The patient's  family history is not on file.   ROS:  Please see the history of present illness.   All other systems are personally reviewed and negative.    Exam:    Vital Signs:  Ht 5\' 10"  (1.778 m)   Wt 190 lb (86.2 kg)   BMI 27.26 kg/m     Labs/Other Tests and Data Reviewed:    Recent Labs: No results found for requested labs within last 8760 hours.   Wt Readings from Last 3 Encounters:  05/22/20 190 lb (86.2 kg)  03/15/19 187 lb 3.2 oz (84.9 kg)  01/06/18 190 lb 12.8 oz (86.5 kg)     Other studies personally reviewed: none  ASSESSMENT & PLAN:    1.  PVC's - he is essentially asymptomatic and thinks that his PVC's are well controlled. He will continue his beta blocker. 2. HTN -he has not been checking his bp. I encouraged him to check at least once a month. 3. Sinus node dysfunction - he is asymptomatic and will undergo watchful waiting.  Follow-up:  1 one year Next remote: n/a  Current medicines are reviewed at length with the patient today.   The patient does not have  concerns regarding his medicines.  The following changes were made today:  none  Labs/ tests ordered today include:  No orders of the defined types were placed in this encounter.    Patient Risk:  after full review of this patients clinical status, I feel that they are at moderate risk at this time.  Today, I have spent 15 minutes with the patient with telehealth technology discussing all of the above .    Signed, Lewayne Bunting, MD  05/22/2020 10:28 AM     [facility] [address] [place] [address] [phone] (office) [phone] (fax)

## 2020-11-27 IMAGING — CR CHEST - 2 VIEW
2 series · 2 of 2 positions shown · non-contrast
Comparison: 11/17/2012

CLINICAL DATA: Shortness of breath

EXAM:
CHEST - 2 VIEW

[chest pa]
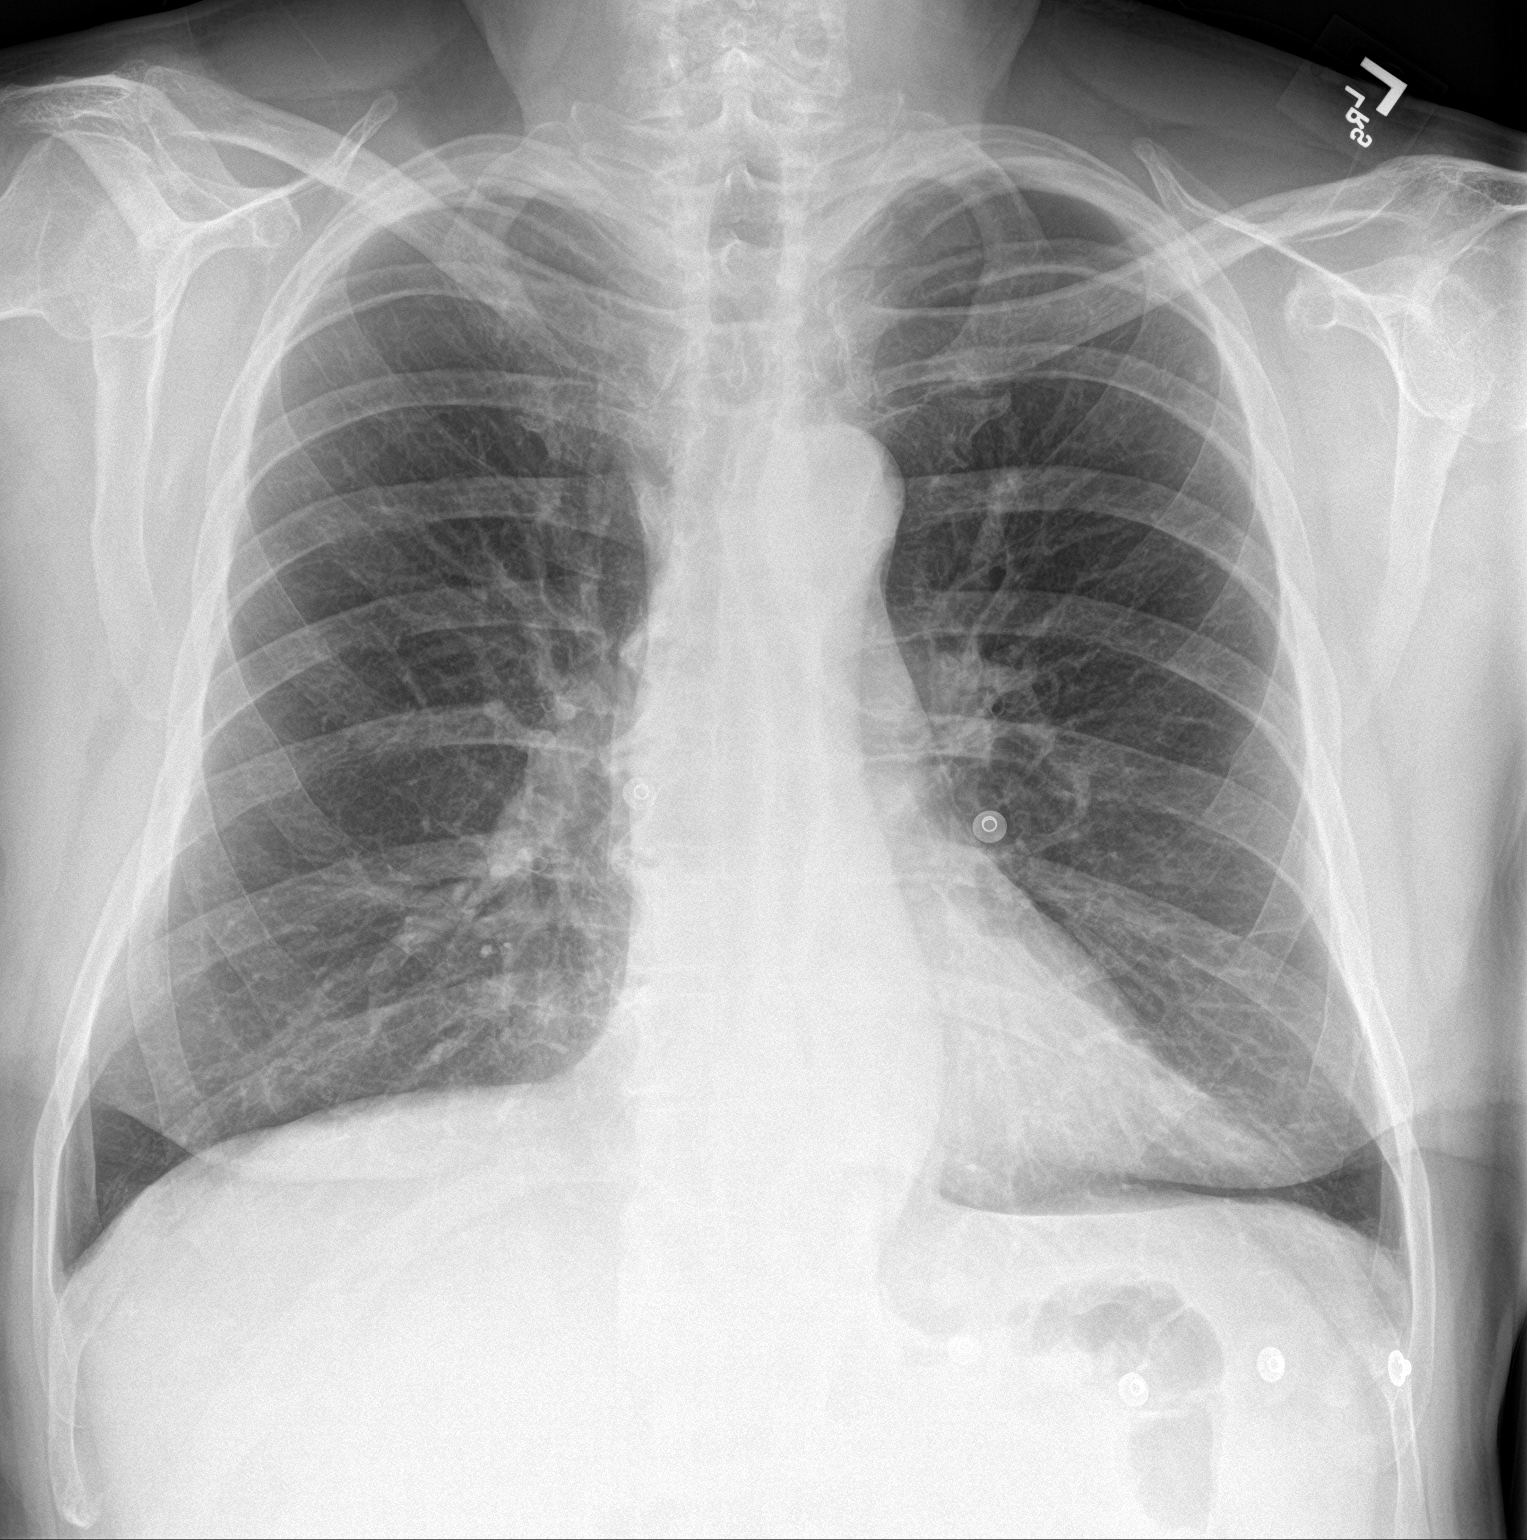

[chest lat]
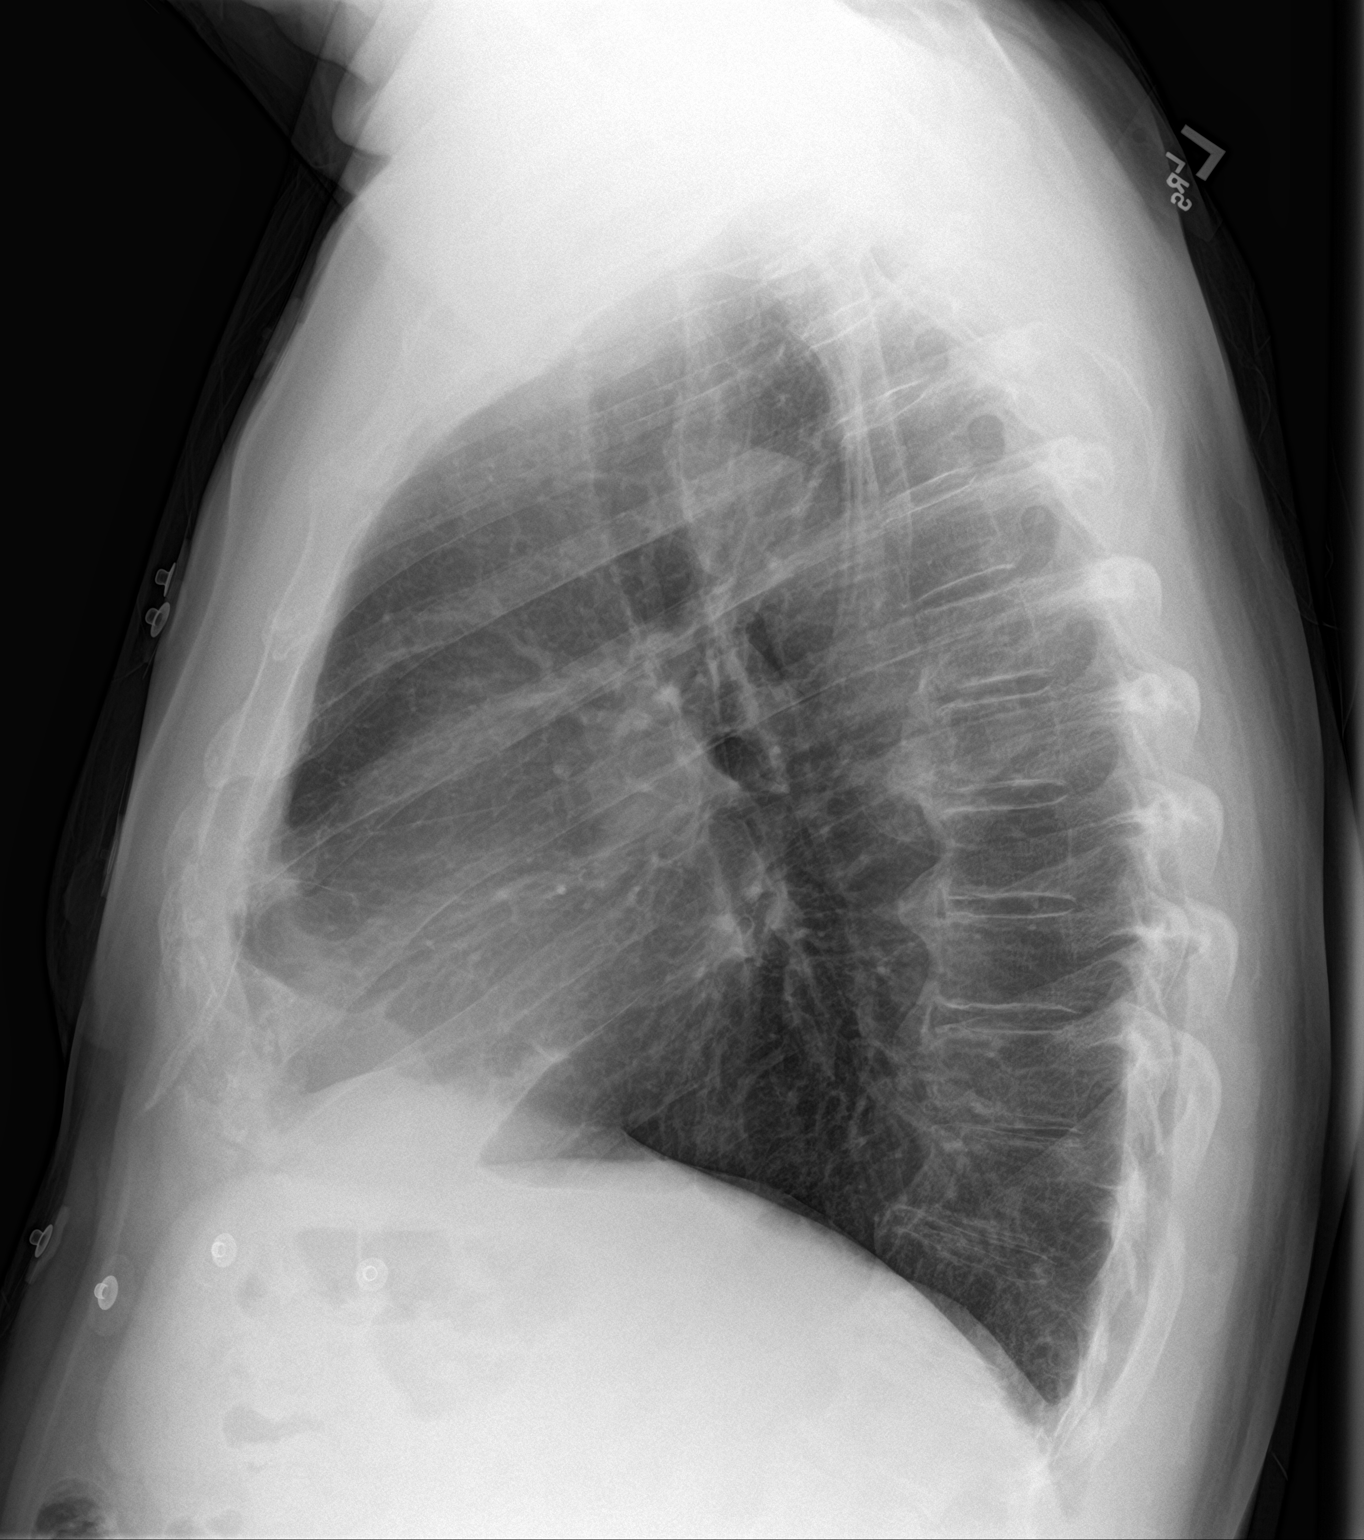

[2 of 2 positions shown; findings below may reference images not displayed]

FINDINGS: Heart and mediastinal contours are within normal limits. No focal
opacities or effusions. No acute bony abnormality.
IMPRESSION: No active cardiopulmonary disease.

## 2021-05-25 ENCOUNTER — Other Ambulatory Visit: Payer: Self-pay | Admitting: Cardiovascular Disease

## 2021-06-22 ENCOUNTER — Other Ambulatory Visit: Payer: Self-pay | Admitting: Cardiovascular Disease

## 2021-08-12 ENCOUNTER — Encounter: Payer: Self-pay | Admitting: Internal Medicine

## 2021-08-12 ENCOUNTER — Ambulatory Visit: Payer: 59 | Admitting: Internal Medicine

## 2021-08-12 VITALS — BP 144/82 | HR 51 | Ht 70.0 in | Wt 196.0 lb

## 2021-08-12 DIAGNOSIS — I493 Ventricular premature depolarization: Secondary | ICD-10-CM | POA: Diagnosis not present

## 2021-08-12 DIAGNOSIS — I10 Essential (primary) hypertension: Secondary | ICD-10-CM

## 2021-08-12 NOTE — Progress Notes (Signed)
? ? ? ? ?  HPI ?Mr. Thome returns today for followup. He is a pleasant 76 yo man with a h/o HTN who has PVC's which have improved markedly. He walks several miles a day and stays active. He admits to sodium indiscretion. He has not had syncope or peripheral edema or sob.  ?No Known Allergies ? ? ?Current Outpatient Medications  ?Medication Sig Dispense Refill  ? atorvastatin (LIPITOR) 10 MG tablet Take 10 mg by mouth daily.  1  ? buPROPion (WELLBUTRIN XL) 150 MG 24 hr tablet Take 150 mg by mouth daily.  3  ? carvedilol (COREG) 6.25 MG tablet Take 1 tablet (6.25 mg total) by mouth 2 (two) times daily with a meal. Please keep upcoming appt in April 2023 with Dr. Ladona Ridgel before anymore refills. Thank you Final Attempt 60 tablet 2  ? lisinopril (PRINIVIL,ZESTRIL) 10 MG tablet Take 10 mg by mouth daily.  2  ? metFORMIN (GLUCOPHAGE-XR) 500 MG 24 hr tablet Take 500 mg by mouth daily with breakfast.    ? ?No current facility-administered medications for this visit.  ? ? ? ?Past Medical History:  ?Diagnosis Date  ? Irregular heart beat   ? ? ?ROS: ? ? All systems reviewed and negative except as noted in the HPI. ? ? ?Past Surgical History:  ?Procedure Laterality Date  ? tendon repair    ? quad and achilles  ? ? ? ?No family history on file. ? ? ?Social History  ? ?Socioeconomic History  ? Marital status: Married  ?  Spouse name: Not on file  ? Number of children: Not on file  ? Years of education: Not on file  ? Highest education level: Not on file  ?Occupational History  ? Not on file  ?Tobacco Use  ? Smoking status: Never  ? Smokeless tobacco: Never  ?Substance and Sexual Activity  ? Alcohol use: Yes  ? Drug use: No  ? Sexual activity: Yes  ?Other Topics Concern  ? Not on file  ?Social History Narrative  ? Not on file  ? ?Social Determinants of Health  ? ?Financial Resource Strain: Not on file  ?Food Insecurity: Not on file  ?Transportation Needs: Not on file  ?Physical Activity: Not on file  ?Stress: Not on file  ?Social  Connections: Not on file  ?Intimate Partner Violence: Not on file  ? ? ? ?BP (!) 144/82   Pulse (!) 51   Ht 5\' 10"  (1.778 m)   Wt 196 lb (88.9 kg)   BMI 28.12 kg/m?  ? ?Physical Exam: ? ?Well appearing NAD ?HEENT: Unremarkable ?Neck:  No JVD, no thyromegally ?Lymphatics:  No adenopathy ?Back:  No CVA tenderness ?Lungs:  Clear with no wheezes ?HEART:  Regular rate rhythm, no murmurs, no rubs, no clicks ?Abd:  soft, positive bowel sounds, no organomegally, no rebound, no guarding ?Ext:  2 plus pulses, no edema, no cyanosis, no clubbing ?Skin:  No rashes no nodules ?Neuro:  CN II through XII intact, motor grossly intact ? ?EKG - sinus bradycardia ? ? ?Assess/Plan:  ?1. PVC's - he has improved and is currently asymptomatic. He is encouraged to undergo watchful waiting and to call if his symptoms recur.  ?2. HTN - he admits to sodium indiscretion. I asked him to reduce his salt intake and to keep walking.  ?3. Sinus node dysfunction - he remains asymptomatic. His HR is better today. ?  ?Kyree Fedorko,M.D. ?

## 2021-08-12 NOTE — Patient Instructions (Addendum)
Medication Instructions:  °Your physician recommends that you continue on your current medications as directed. Please refer to the Current Medication list given to you today. ° °Labwork: °None ordered. ° °Testing/Procedures: °None ordered. ° °Follow-Up: °Your physician wants you to follow-up in: 2 years with Gregg Taylor, MD  °You will receive a reminder letter in the mail two months in advance. If you don't receive a letter, please call our office to schedule the follow-up appointment. ° ° °Any Other Special Instructions Will Be Listed Below (If Applicable). ° °If you need a refill on your cardiac medications before your next appointment, please call your pharmacy.  ° ° ° ° °

## 2021-09-25 ENCOUNTER — Other Ambulatory Visit: Payer: Self-pay | Admitting: Internal Medicine

## 2022-08-18 ENCOUNTER — Other Ambulatory Visit: Payer: Self-pay

## 2022-08-18 MED ORDER — ATORVASTATIN CALCIUM 10 MG PO TABS: 10.0000 mg | ORAL_TABLET | Freq: Every day | ORAL | 3 refills | Status: DC

## 2022-09-19 ENCOUNTER — Other Ambulatory Visit: Payer: Self-pay | Admitting: Internal Medicine

## 2022-10-11 ENCOUNTER — Other Ambulatory Visit: Payer: Self-pay | Admitting: Internal Medicine

## 2022-10-22 ENCOUNTER — Other Ambulatory Visit: Payer: Self-pay | Admitting: Internal Medicine

## 2023-07-06 DIAGNOSIS — J988 Other specified respiratory disorders: Secondary | ICD-10-CM | POA: Diagnosis not present

## 2023-07-06 DIAGNOSIS — B9789 Other viral agents as the cause of diseases classified elsewhere: Secondary | ICD-10-CM | POA: Diagnosis not present

## 2023-07-07 ENCOUNTER — Other Ambulatory Visit: Payer: Self-pay | Admitting: Internal Medicine

## 2023-07-14 DIAGNOSIS — J40 Bronchitis, not specified as acute or chronic: Secondary | ICD-10-CM | POA: Diagnosis not present

## 2023-08-03 ENCOUNTER — Encounter: Payer: Self-pay | Admitting: Internal Medicine

## 2023-08-03 ENCOUNTER — Ambulatory Visit: Payer: 59 | Attending: Internal Medicine | Admitting: Internal Medicine

## 2023-08-03 VITALS — BP 126/68 | HR 60 | Ht 70.0 in | Wt 200.0 lb

## 2023-08-03 DIAGNOSIS — I493 Ventricular premature depolarization: Secondary | ICD-10-CM | POA: Diagnosis not present

## 2023-08-03 NOTE — Patient Instructions (Signed)

## 2023-08-03 NOTE — Progress Notes (Signed)
      HPI Wesley Burch returns today for followup. He is a pleasant 78 yo man with a h/o HTN who has PVC's which have improved markedly. He walks several miles a day and stays active. He admits to sodium indiscretion. He has not had syncope or peripheral edema or sob.   No Known Allergies   Current Outpatient Medications  Medication Sig Dispense Refill   atorvastatin (LIPITOR) 10 MG tablet Take 1 tablet (10 mg total) by mouth daily. 90 tablet 3   buPROPion (WELLBUTRIN XL) 150 MG 24 hr tablet Take 150 mg by mouth daily.  3   carvedilol (COREG) 6.25 MG tablet TAKE 1 TABLET BY MOUTH TWICE A DAY WITH FOOD 180 tablet 0   lisinopril (PRINIVIL,ZESTRIL) 10 MG tablet Take 10 mg by mouth daily.  2   metFORMIN (GLUCOPHAGE-XR) 500 MG 24 hr tablet Take 500 mg by mouth daily with breakfast.     No current facility-administered medications for this visit.     Past Medical History:  Diagnosis Date   Irregular heart beat     ROS:   All systems reviewed and negative except as noted in the HPI.   Past Surgical History:  Procedure Laterality Date   tendon repair     quad and achilles     History reviewed. No pertinent family history.   Social History   Socioeconomic History   Marital status: Married    Spouse name: Not on file   Number of children: Not on file   Years of education: Not on file   Highest education level: Not on file  Occupational History   Not on file  Tobacco Use   Smoking status: Never   Smokeless tobacco: Never  Substance and Sexual Activity   Alcohol use: Yes   Drug use: No   Sexual activity: Yes  Other Topics Concern   Not on file  Social History Narrative   Not on file   Social Drivers of Health   Financial Resource Strain: Not on file  Food Insecurity: Not on file  Transportation Needs: Not on file  Physical Activity: Not on file  Stress: Not on file  Social Connections: Not on file  Intimate Partner Violence: Not on file     BP 126/68    Pulse 60   Ht 5\' 10"  (1.778 m)   Wt 200 lb (90.7 kg)   SpO2 93%   BMI 28.70 kg/m   Physical Exam:  Well appearing NAD HEENT: Unremarkable Neck:  No JVD, no thyromegally Lymphatics:  No adenopathy Back:  No CVA tenderness Lungs:  Clear HEART:  Regular rate rhythm, no murmurs, no rubs, no clicks Abd:  soft, positive bowel sounds, no organomegally, no rebound, no guarding Ext:  2 plus pulses, no edema, no cyanosis, no clubbing Skin:  No rashes no nodules Neuro:  CN II through XII intact, motor grossly intact  EKG  DEVICE  Normal device function.  See PaceArt for details.   Assess/Plan: 1. PVC's - he has improved and is currently asymptomatic. He is encouraged to undergo watchful waiting and to call us if his symptoms recur.  2. HTN - he admits to sodium indiscretion. I asked him to reduce his salt intake and to keep walking.  3. Sinus node dysfunction - he remains asymptomatic. His HR is better today.   Leonia Reeves.D.

## 2023-08-06 ENCOUNTER — Other Ambulatory Visit: Payer: Self-pay | Admitting: Internal Medicine

## 2023-08-10 DIAGNOSIS — G629 Polyneuropathy, unspecified: Secondary | ICD-10-CM | POA: Diagnosis not present

## 2023-08-10 DIAGNOSIS — Z0000 Encounter for general adult medical examination without abnormal findings: Secondary | ICD-10-CM | POA: Diagnosis not present

## 2023-08-10 DIAGNOSIS — Z1211 Encounter for screening for malignant neoplasm of colon: Secondary | ICD-10-CM | POA: Diagnosis not present

## 2023-08-10 DIAGNOSIS — E785 Hyperlipidemia, unspecified: Secondary | ICD-10-CM | POA: Diagnosis not present

## 2023-08-10 DIAGNOSIS — I10 Essential (primary) hypertension: Secondary | ICD-10-CM | POA: Diagnosis not present

## 2023-08-10 DIAGNOSIS — E119 Type 2 diabetes mellitus without complications: Secondary | ICD-10-CM | POA: Diagnosis not present

## 2023-09-04 DIAGNOSIS — N289 Disorder of kidney and ureter, unspecified: Secondary | ICD-10-CM | POA: Diagnosis not present

## 2023-11-02 DIAGNOSIS — J40 Bronchitis, not specified as acute or chronic: Secondary | ICD-10-CM | POA: Diagnosis not present

## 2023-11-02 DIAGNOSIS — E119 Type 2 diabetes mellitus without complications: Secondary | ICD-10-CM | POA: Diagnosis not present

## 2023-11-02 DIAGNOSIS — I10 Essential (primary) hypertension: Secondary | ICD-10-CM | POA: Diagnosis not present

## 2023-11-11 DIAGNOSIS — I10 Essential (primary) hypertension: Secondary | ICD-10-CM | POA: Diagnosis not present

## 2023-11-11 DIAGNOSIS — E119 Type 2 diabetes mellitus without complications: Secondary | ICD-10-CM | POA: Diagnosis not present

## 2023-12-03 DIAGNOSIS — J40 Bronchitis, not specified as acute or chronic: Secondary | ICD-10-CM | POA: Diagnosis not present

## 2023-12-03 DIAGNOSIS — E119 Type 2 diabetes mellitus without complications: Secondary | ICD-10-CM | POA: Diagnosis not present

## 2023-12-03 DIAGNOSIS — I10 Essential (primary) hypertension: Secondary | ICD-10-CM | POA: Diagnosis not present

## 2023-12-08 ENCOUNTER — Other Ambulatory Visit: Payer: Self-pay

## 2023-12-08 MED ORDER — CARVEDILOL 6.25 MG PO TABS: 6.2500 mg | ORAL_TABLET | Freq: Two times a day (BID) | ORAL | 2 refills | Status: AC

## 2024-01-03 DIAGNOSIS — I10 Essential (primary) hypertension: Secondary | ICD-10-CM | POA: Diagnosis not present

## 2024-01-03 DIAGNOSIS — J40 Bronchitis, not specified as acute or chronic: Secondary | ICD-10-CM | POA: Diagnosis not present

## 2024-01-03 DIAGNOSIS — E119 Type 2 diabetes mellitus without complications: Secondary | ICD-10-CM | POA: Diagnosis not present

## 2024-03-29 ENCOUNTER — Encounter: Payer: Self-pay | Admitting: *Deleted

## 2024-03-29 NOTE — Progress Notes (Signed)
 Wesley Burch                                          MRN: [id number]   03/29/2024   The [facility] Quality Team Specialist reviewed this patient medical record for the purposes of chart review for care gap closure. The following were reviewed: chart review for care gap closure-kidney health evaluation for diabetes:eGFR  and uACR.    [facility] Quality Team
